# Patient Record
Sex: Male | Born: 1995 | Race: White | Hispanic: No | Marital: Single | Smoking: Never smoker
Health system: Southern US, Community
[De-identification: ages and names within clinical notes are randomized; demographics above are authoritative.]

---
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
## 2017-07-18 IMAGING — CT CT ABD-PELV W/ CM
2 of 4 series · 15 of 46 positions shown, 17 images · IV contrast (iopamidol)
Comparison: None.

CLINICAL DATA: Right lower quadrant abdominal pain for 3 days.

EXAM:
CT ABDOMEN AND PELVIS WITH CONTRAST
TECHNIQUE: Multidetector CT imaging of the abdomen and pelvis was performed
using the standard protocol following bolus administration of
intravenous contrast.
CONTRAST:  100mL [id number] IOPAMIDOL (U1XIZ6-[id number]) INJECTION 61%

[Series 2: routine abd pel with · axial · 0.73mm/px · z∈[-539,-44]mm · 12 of 109 slices shown, 14 images]
[im 5/109  soft-tissue]
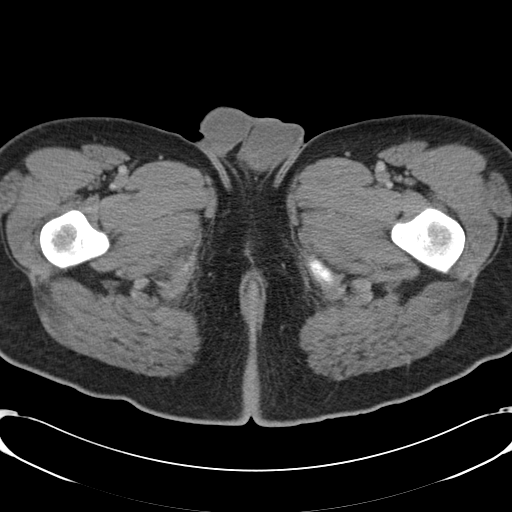
[im 5/109  bone]
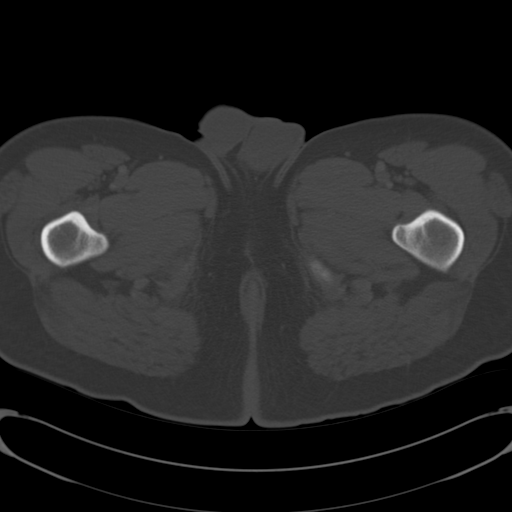
[im 14/109  soft-tissue]
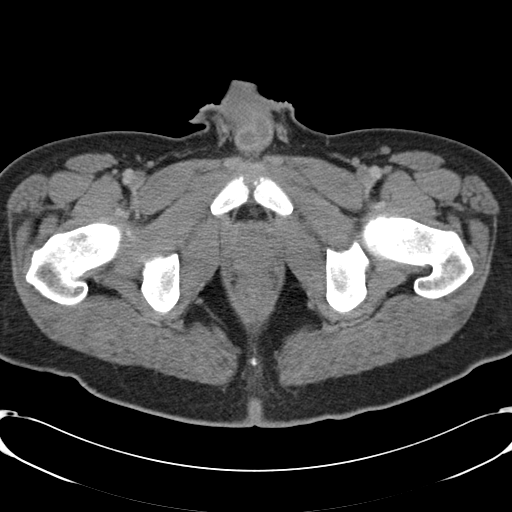
[im 23/109  soft-tissue]
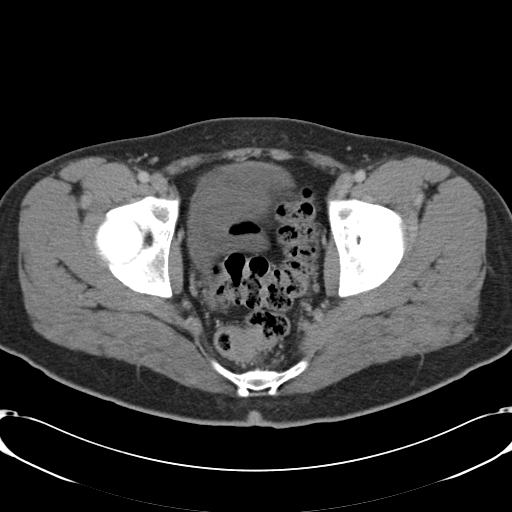
[im 32/109  soft-tissue]
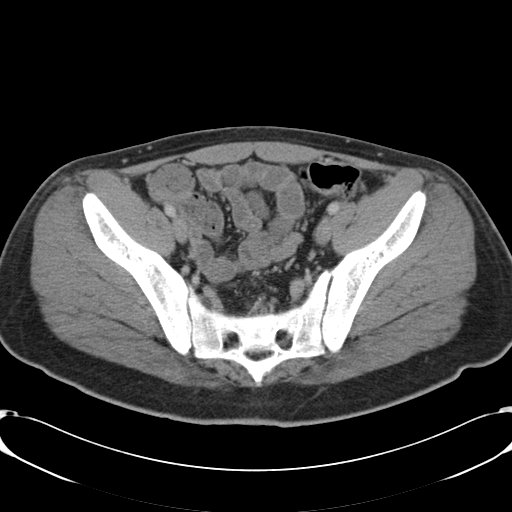
[im 41/109  soft-tissue]
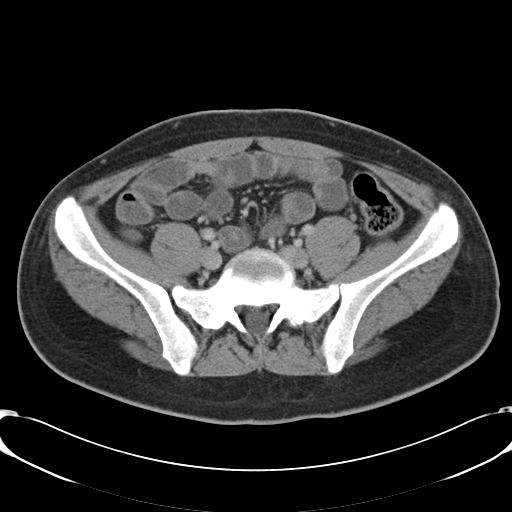
[im 50/109  soft-tissue]
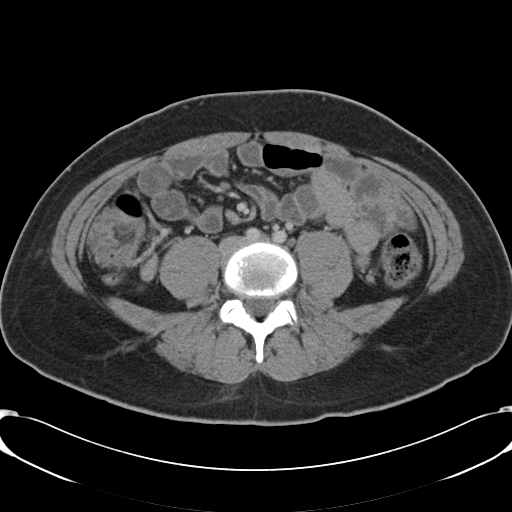
[im 59/109  soft-tissue]
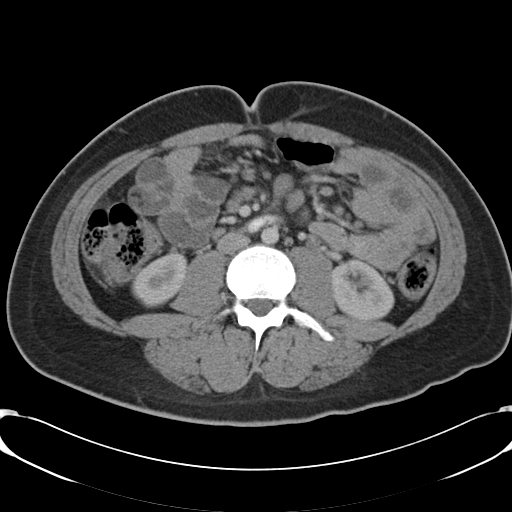
[im 68/109  soft-tissue]
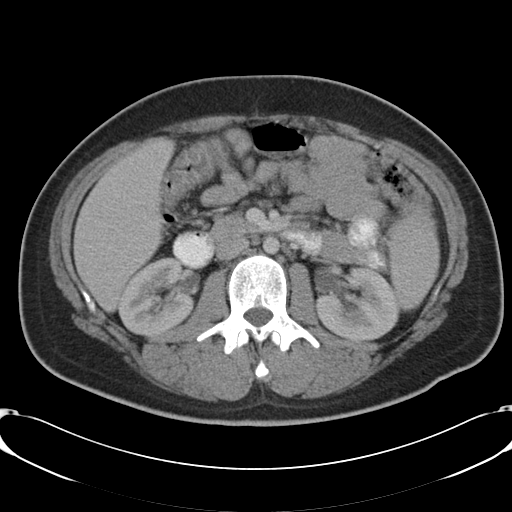
[im 77/109  soft-tissue]
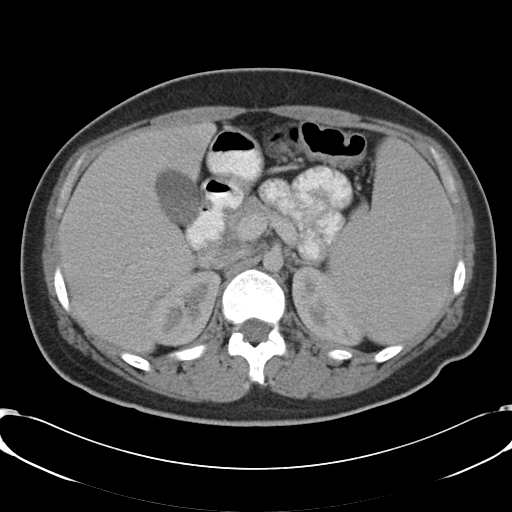
[im 77/109  bone]
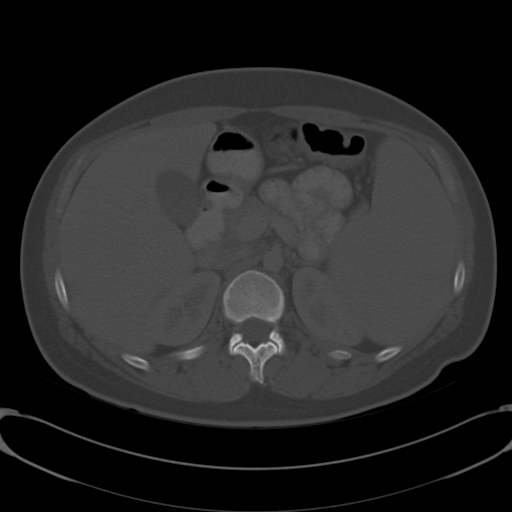
[im 86/109  soft-tissue]
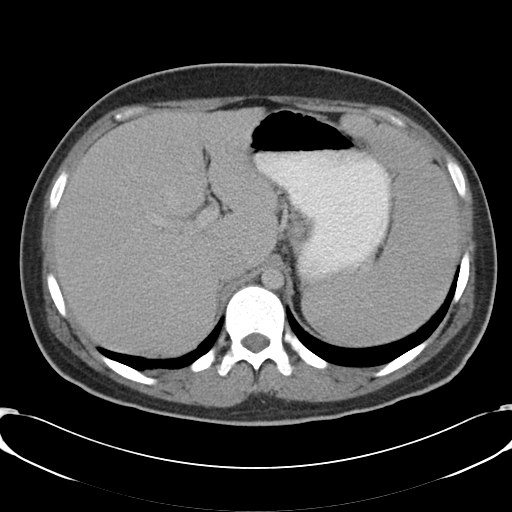
[im 95/109  soft-tissue]
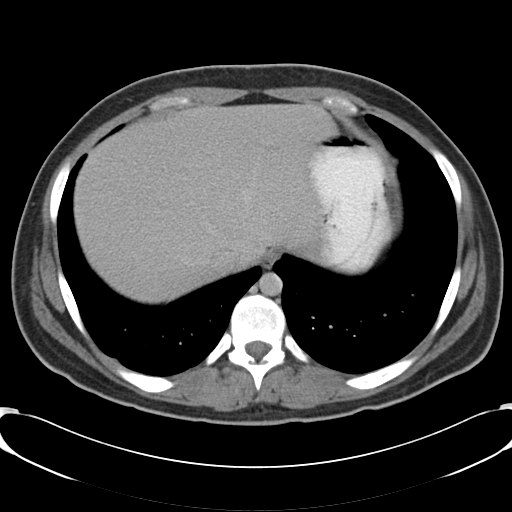
[im 104/109  soft-tissue]
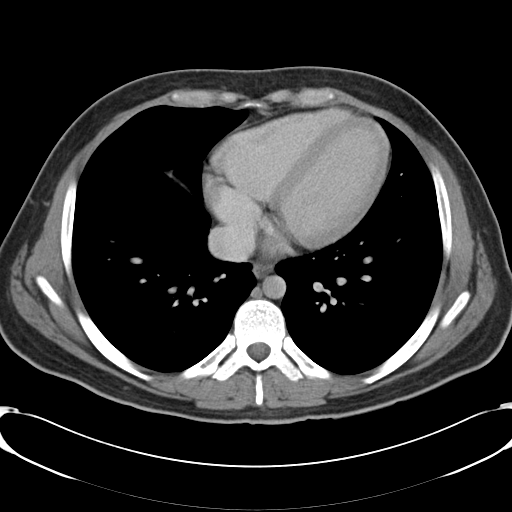

[Series 5: cor routine abd pel with · coronal · 0.75mm/px · 3 of 124 slices shown]
[im 42/124  soft-tissue]
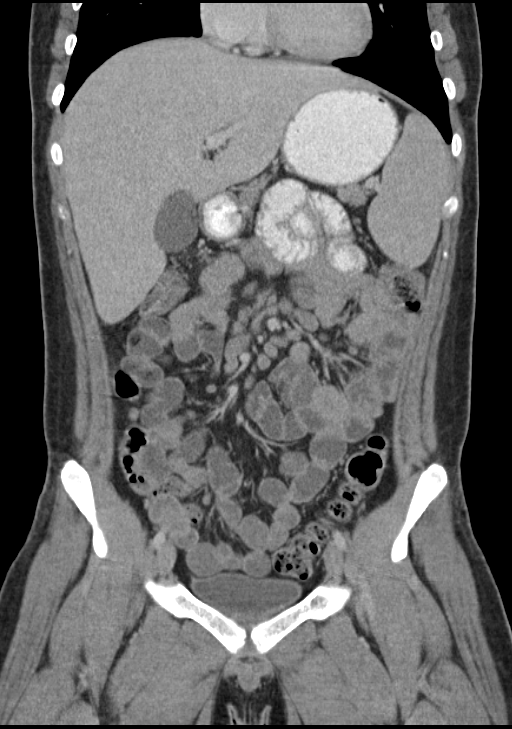
[im 55/124  soft-tissue]
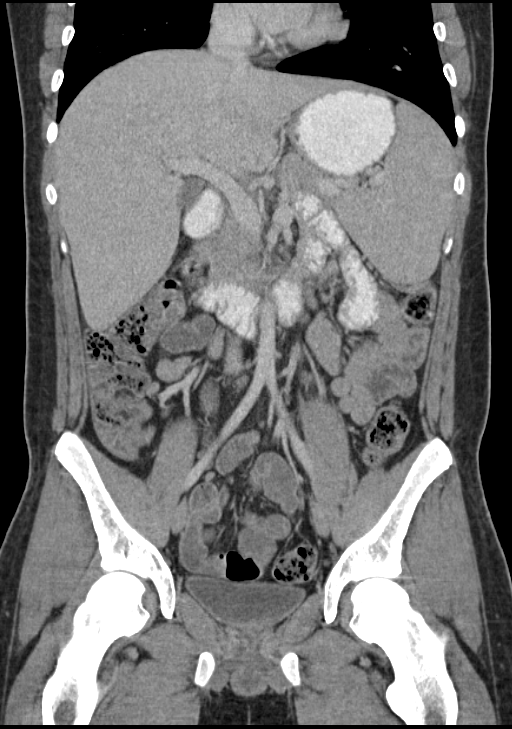
[im 69/124  soft-tissue]
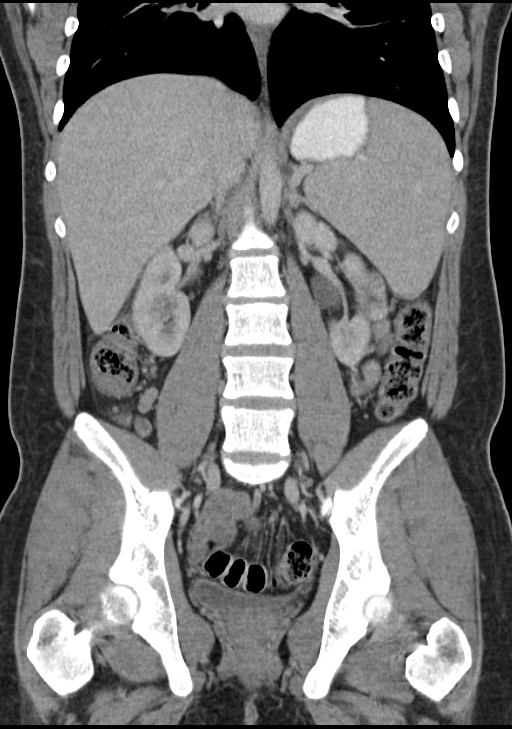

[15 of 46 positions shown; findings below may reference images not displayed]

FINDINGS: Lower chest:  No significant abnormality

Hepatobiliary: There are normal appearances of the liver,
gallbladder and bile ducts.

Pancreas: Normal

Spleen: The spleen is enlarged, measuring approximately 9.7 x 16.9 x
14.7 cm. No focal splenic lesion. There is mild mass effect on the
stomach and left kidney by the enlarged spleen.

Adrenals/Urinary Tract: The adrenals and kidneys are normal in
appearance. There is no urinary calculus evident. There is no
hydronephrosis or ureteral dilatation. Collecting systems and
ureters appear unremarkable.

Stomach/Bowel: There are normal appearances of the stomach, small
bowel and colon. The appendix is normal.

Vascular/Lymphatic: The abdominal aorta is normal in caliber. There
is no atherosclerotic calcification.

There are multiple enlarged mesenteric lymph nodes, particularly in
the abdominal right lower quadrant. The pattern of adenopathy
suggests mesenteric adenitis. However, the accompanying splenomegaly
is concerning.

Reproductive: Unremarkable

Other: No focal inflammatory changes are evident in the abdomen or
pelvis.

Musculoskeletal: No significant musculoskeletal abnormality.
IMPRESSION: Prominent mesenteric adenopathy, particularly in the right lower
quadrant. Although this could represent mesenteric adenitis, there
also is splenomegaly. Possibility of neoplasm or significant
hematologic disease is a consideration. These results were called by
telephone at the time of interpretation on 05/04/2015 at [DATE] to
Dr. ALINKA MMA , who verbally acknowledged these results.
